# Patient Record
Sex: Female | Born: 1970 | Hispanic: Yes | Marital: Married | Smoking: Never smoker
Health system: Southern US, Community
[De-identification: ages and names within clinical notes are randomized; demographics above are authoritative.]

## PROBLEM LIST (undated history)

## (undated) DIAGNOSIS — I10 Essential (primary) hypertension: Secondary | ICD-10-CM

---

## 2004-09-25 ENCOUNTER — Ambulatory Visit: Payer: Self-pay | Admitting: Emergency Medicine

## 2004-09-25 ENCOUNTER — Emergency Department: Payer: Self-pay | Admitting: Emergency Medicine

## 2013-09-02 ENCOUNTER — Ambulatory Visit: Payer: Self-pay

## 2017-07-26 ENCOUNTER — Other Ambulatory Visit: Payer: Self-pay

## 2017-07-26 ENCOUNTER — Encounter: Payer: Self-pay | Admitting: Emergency Medicine

## 2017-07-26 DIAGNOSIS — Z23 Encounter for immunization: Secondary | ICD-10-CM | POA: Insufficient documentation

## 2017-07-26 DIAGNOSIS — Z79899 Other long term (current) drug therapy: Secondary | ICD-10-CM | POA: Insufficient documentation

## 2017-07-26 DIAGNOSIS — I10 Essential (primary) hypertension: Secondary | ICD-10-CM | POA: Insufficient documentation

## 2017-07-26 DIAGNOSIS — K8010 Calculus of gallbladder with chronic cholecystitis without obstruction: Principal | ICD-10-CM | POA: Insufficient documentation

## 2017-07-26 LAB — COMPREHENSIVE METABOLIC PANEL
ALBUMIN: 3.9 g/dL (ref 3.5–5.0)
ALK PHOS: 117 U/L (ref 38–126)
ALT: 25 U/L (ref 14–54)
AST: 27 U/L (ref 15–41)
Anion gap: 12 (ref 5–15)
BUN: 7 mg/dL (ref 6–20)
CALCIUM: 9 mg/dL (ref 8.9–10.3)
CO2: 22 mmol/L (ref 22–32)
CREATININE: 0.49 mg/dL (ref 0.44–1.00)
Chloride: 99 mmol/L — ABNORMAL LOW (ref 101–111)
GFR calc Af Amer: >60 mL/min (ref >60)
GFR calc non Af Amer: >60 mL/min (ref >60)
GLUCOSE: 103 mg/dL — AB (ref 65–99)
Potassium: 3 mmol/L — ABNORMAL LOW (ref 3.5–5.1)
SODIUM: 133 mmol/L — AB (ref 135–145)
Total Bilirubin: 0.5 mg/dL (ref 0.3–1.2)
Total Protein: 7.7 g/dL (ref 6.5–8.1)

## 2017-07-26 LAB — CBC
HCT: 33.7 % — ABNORMAL LOW (ref 35.0–47.0)
HEMOGLOBIN: 11.6 g/dL — AB (ref 12.0–16.0)
MCH: 27.6 pg (ref 26.0–34.0)
MCHC: 34.3 g/dL (ref 32.0–36.0)
MCV: 80.7 fL (ref 80.0–100.0)
PLATELETS: 393 10*3/uL (ref 150–440)
RBC: 4.18 MIL/uL (ref 3.80–5.20)
RDW: 13.9 % (ref 11.5–14.5)
WBC: 11.6 10*3/uL — ABNORMAL HIGH (ref 3.6–11.0)

## 2017-07-26 LAB — URINALYSIS, COMPLETE (UACMP) WITH MICROSCOPIC
Bilirubin Urine: NEGATIVE
Glucose, UA: NEGATIVE mg/dL
Ketones, ur: NEGATIVE mg/dL
Nitrite: NEGATIVE
Protein, ur: NEGATIVE mg/dL
SPECIFIC GRAVITY, URINE: 1.005 (ref 1.005–1.030)
pH: 6 (ref 5.0–8.0)

## 2017-07-26 LAB — LIPASE, BLOOD: Lipase: 24 U/L (ref 11–51)

## 2017-07-26 LAB — POCT PREGNANCY, URINE: PREG TEST UR: NEGATIVE

## 2017-07-26 NOTE — ED Triage Notes (Addendum)
Patient with complaint of right upper abdominal pain that started on Tuesday. Patient was seen by PCP on Wednesday. Patient was started on Dicyclomine with no improvement. Patient PCP told her that it maybe her gallbladder.  Patient with rash to right arm that started this morning.

## 2017-07-27 ENCOUNTER — Encounter
Admission: EM | Disposition: A | Discharge status: Home / Self Care | Payer: Self-pay | Source: Home / Self Care | Attending: Emergency Medicine

## 2017-07-27 ENCOUNTER — Observation Stay
Admission: EM | Admit: 2017-07-27 | Discharge: 2017-07-28 | Disposition: A | Discharge status: Home / Self Care | Payer: Self-pay | Attending: Surgery | Admitting: Surgery

## 2017-07-27 ENCOUNTER — Encounter: Payer: Self-pay | Admitting: Anesthesiology

## 2017-07-27 ENCOUNTER — Observation Stay: Payer: Self-pay | Admitting: Anesthesiology

## 2017-07-27 ENCOUNTER — Other Ambulatory Visit: Payer: Self-pay

## 2017-07-27 ENCOUNTER — Emergency Department: Payer: Self-pay

## 2017-07-27 DIAGNOSIS — K819 Cholecystitis, unspecified: Secondary | ICD-10-CM

## 2017-07-27 DIAGNOSIS — E876 Hypokalemia: Secondary | ICD-10-CM

## 2017-07-27 DIAGNOSIS — K811 Chronic cholecystitis: Secondary | ICD-10-CM

## 2017-07-27 DIAGNOSIS — K810 Acute cholecystitis: Secondary | ICD-10-CM | POA: Diagnosis present

## 2017-07-27 HISTORY — DX: Essential (primary) hypertension: I10

## 2017-07-27 HISTORY — PX: CHOLECYSTECTOMY: SHX55

## 2017-07-27 LAB — SURGICAL PCR SCREEN
MRSA, PCR: NEGATIVE
STAPHYLOCOCCUS AUREUS: NEGATIVE

## 2017-07-27 SURGERY — LAPAROSCOPIC CHOLECYSTECTOMY
Anesthesia: General

## 2017-07-27 MED ORDER — ENOXAPARIN SODIUM 40 MG/0.4ML ~~LOC~~ SOLN
40.0000 mg | SUBCUTANEOUS | Status: DC
  Administered 2017-07-28: 40 mg via SUBCUTANEOUS
  Filled 2017-07-27: qty 0.4

## 2017-07-27 MED ORDER — ACETAMINOPHEN 650 MG RE SUPP
650.0000 mg | Freq: Four times a day (QID) | RECTAL | Status: DC | PRN
  Filled 2017-07-27: qty 1

## 2017-07-27 MED ORDER — MORPHINE SULFATE (PF) 4 MG/ML IV SOLN
4.0000 mg | Freq: Once | INTRAVENOUS | Status: AC
  Administered 2017-07-27: 4 mg via INTRAVENOUS
  Filled 2017-07-27: qty 1

## 2017-07-27 MED ORDER — LIDOCAINE HCL (PF) 2 % IJ SOLN
INTRAMUSCULAR | Status: AC
  Filled 2017-07-27: qty 10

## 2017-07-27 MED ORDER — ONDANSETRON HCL 4 MG/2ML IJ SOLN
4.0000 mg | INTRAMUSCULAR | Status: AC
  Administered 2017-07-27: 4 mg via INTRAVENOUS
  Filled 2017-07-27: qty 2

## 2017-07-27 MED ORDER — LIDOCAINE HCL 1 % IJ SOLN
INTRAMUSCULAR | Status: DC | PRN
  Administered 2017-07-27: 10 mL

## 2017-07-27 MED ORDER — PIPERACILLIN-TAZOBACTAM 3.375 G IVPB 30 MIN: 3.3750 g | Freq: Three times a day (TID) | INTRAVENOUS | Status: DC

## 2017-07-27 MED ORDER — ONDANSETRON HCL 4 MG/2ML IJ SOLN
INTRAMUSCULAR | Status: DC | PRN
  Administered 2017-07-27: 4 mg via INTRAVENOUS

## 2017-07-27 MED ORDER — FENTANYL CITRATE (PF) 100 MCG/2ML IJ SOLN
25.0000 ug | INTRAMUSCULAR | Status: DC | PRN
  Administered 2017-07-27 (×2): 50 ug via INTRAVENOUS

## 2017-07-27 MED ORDER — PROPOFOL 10 MG/ML IV BOLUS
INTRAVENOUS | Status: AC
  Filled 2017-07-27: qty 20

## 2017-07-27 MED ORDER — ONDANSETRON HCL 4 MG/2ML IJ SOLN: 4.0000 mg | Freq: Four times a day (QID) | INTRAMUSCULAR | Status: DC | PRN

## 2017-07-27 MED ORDER — CHLORHEXIDINE GLUCONATE CLOTH 2 % EX PADS: 6.0000 | MEDICATED_PAD | Freq: Once | CUTANEOUS | Status: DC

## 2017-07-27 MED ORDER — ROCURONIUM BROMIDE 100 MG/10ML IV SOLN
INTRAVENOUS | Status: DC | PRN
  Administered 2017-07-27: 30 mg via INTRAVENOUS

## 2017-07-27 MED ORDER — MORPHINE SULFATE (PF) 2 MG/ML IV SOLN: 2.0000 mg | INTRAVENOUS | Status: DC | PRN

## 2017-07-27 MED ORDER — CHLORHEXIDINE GLUCONATE CLOTH 2 % EX PADS
6.0000 | MEDICATED_PAD | Freq: Once | CUTANEOUS | Status: AC
  Administered 2017-07-27: 6 via TOPICAL

## 2017-07-27 MED ORDER — DEXAMETHASONE SODIUM PHOSPHATE 10 MG/ML IJ SOLN
INTRAMUSCULAR | Status: DC | PRN
  Administered 2017-07-27: 10 mg via INTRAVENOUS

## 2017-07-27 MED ORDER — ONDANSETRON HCL 4 MG PO TABS: 4.0000 mg | ORAL_TABLET | Freq: Four times a day (QID) | ORAL | Status: DC | PRN

## 2017-07-27 MED ORDER — FENTANYL CITRATE (PF) 100 MCG/2ML IJ SOLN
INTRAMUSCULAR | Status: AC
  Filled 2017-07-27: qty 2

## 2017-07-27 MED ORDER — DEXAMETHASONE SODIUM PHOSPHATE 10 MG/ML IJ SOLN
INTRAMUSCULAR | Status: AC
  Filled 2017-07-27: qty 1

## 2017-07-27 MED ORDER — PROMETHAZINE HCL 25 MG/ML IJ SOLN
6.2500 mg | INTRAMUSCULAR | Status: DC | PRN
  Administered 2017-07-27: 12.5 mg via INTRAVENOUS

## 2017-07-27 MED ORDER — FENTANYL CITRATE (PF) 100 MCG/2ML IJ SOLN
INTRAMUSCULAR | Status: AC
  Administered 2017-07-27: 13:00:00
  Filled 2017-07-27: qty 2

## 2017-07-27 MED ORDER — PIPERACILLIN-TAZOBACTAM 3.375 G IVPB
3.3750 g | Freq: Three times a day (TID) | INTRAVENOUS | Status: DC
  Administered 2017-07-27 – 2017-07-28 (×4): 3.375 g via INTRAVENOUS
  Filled 2017-07-27 (×4): qty 50

## 2017-07-27 MED ORDER — MIDAZOLAM HCL 2 MG/2ML IJ SOLN
INTRAMUSCULAR | Status: DC | PRN
  Administered 2017-07-27: 2 mg via INTRAVENOUS

## 2017-07-27 MED ORDER — MORPHINE SULFATE (PF) 2 MG/ML IV SOLN
2.0000 mg | INTRAVENOUS | Status: DC | PRN
  Administered 2017-07-27 (×2): 2 mg via INTRAVENOUS
  Filled 2017-07-27 (×2): qty 1

## 2017-07-27 MED ORDER — OXYCODONE-ACETAMINOPHEN 5-325 MG PO TABS
1.0000 | ORAL_TABLET | ORAL | Status: DC | PRN
  Administered 2017-07-27 – 2017-07-28 (×3): 2 via ORAL
  Filled 2017-07-27 (×3): qty 2
  Filled 2017-07-27: qty 1

## 2017-07-27 MED ORDER — ONDANSETRON HCL 4 MG/2ML IJ SOLN
4.0000 mg | Freq: Four times a day (QID) | INTRAMUSCULAR | Status: DC | PRN
Start: 2017-07-27 — End: 2017-07-28

## 2017-07-27 MED ORDER — SODIUM CHLORIDE 0.9 % IV SOLN: Freq: Once | INTRAVENOUS | Status: DC

## 2017-07-27 MED ORDER — SUGAMMADEX SODIUM 200 MG/2ML IV SOLN
INTRAVENOUS | Status: DC | PRN
  Administered 2017-07-27: 150 mg via INTRAVENOUS

## 2017-07-27 MED ORDER — PROPOFOL 10 MG/ML IV BOLUS
INTRAVENOUS | Status: DC | PRN
  Administered 2017-07-27: 130 mg via INTRAVENOUS

## 2017-07-27 MED ORDER — ACETAMINOPHEN 325 MG PO TABS: 650.0000 mg | ORAL_TABLET | Freq: Four times a day (QID) | ORAL | Status: DC | PRN

## 2017-07-27 MED ORDER — LIDOCAINE HCL (CARDIAC) 20 MG/ML IV SOLN
INTRAVENOUS | Status: DC | PRN
  Administered 2017-07-27: 70 mg via INTRAVENOUS

## 2017-07-27 MED ORDER — KCL IN DEXTROSE-NACL 20-5-0.45 MEQ/L-%-% IV SOLN
INTRAVENOUS | Status: DC
  Administered 2017-07-27: 08:00:00 via INTRAVENOUS
  Filled 2017-07-27 (×3): qty 1000

## 2017-07-27 MED ORDER — INFLUENZA VAC SPLIT QUAD 0.5 ML IM SUSY
0.5000 mL | PREFILLED_SYRINGE | INTRAMUSCULAR | Status: AC
  Administered 2017-07-28: 0.5 mL via INTRAMUSCULAR
  Filled 2017-07-27: qty 0.5

## 2017-07-27 MED ORDER — LACTATED RINGERS IV SOLN
INTRAVENOUS | Status: DC | PRN
  Administered 2017-07-27: 10:00:00 via INTRAVENOUS

## 2017-07-27 MED ORDER — MIDAZOLAM HCL 2 MG/2ML IJ SOLN
INTRAMUSCULAR | Status: AC
  Filled 2017-07-27: qty 2

## 2017-07-27 MED ORDER — KCL IN DEXTROSE-NACL 20-5-0.45 MEQ/L-%-% IV SOLN
INTRAVENOUS | Status: DC
  Administered 2017-07-27 – 2017-07-28 (×3): via INTRAVENOUS
  Filled 2017-07-27 (×3): qty 1000

## 2017-07-27 MED ORDER — HEPARIN SODIUM (PORCINE) 5000 UNIT/ML IJ SOLN: 5000.0000 [IU] | Freq: Three times a day (TID) | INTRAMUSCULAR | Status: DC

## 2017-07-27 MED ORDER — SUGAMMADEX SODIUM 200 MG/2ML IV SOLN
INTRAVENOUS | Status: AC
  Filled 2017-07-27: qty 2

## 2017-07-27 MED ORDER — ONDANSETRON HCL 4 MG/2ML IJ SOLN
INTRAMUSCULAR | Status: AC
  Filled 2017-07-27: qty 2

## 2017-07-27 MED ORDER — FENTANYL CITRATE (PF) 100 MCG/2ML IJ SOLN
INTRAMUSCULAR | Status: DC | PRN
  Administered 2017-07-27 (×4): 50 ug via INTRAVENOUS

## 2017-07-27 MED ORDER — POTASSIUM CHLORIDE 10 MEQ/100ML IV SOLN
10.0000 meq | INTRAVENOUS | Status: DC
  Administered 2017-07-27 (×2): 10 meq via INTRAVENOUS
  Filled 2017-07-27 (×5): qty 100

## 2017-07-27 MED ORDER — ONDANSETRON 4 MG PO TBDP: 4.0000 mg | ORAL_TABLET | Freq: Four times a day (QID) | ORAL | Status: DC | PRN

## 2017-07-27 MED ORDER — SUCCINYLCHOLINE CHLORIDE 20 MG/ML IJ SOLN
INTRAMUSCULAR | Status: DC | PRN
  Administered 2017-07-27: 80 mg via INTRAVENOUS

## 2017-07-27 SURGICAL SUPPLY — 34 items
APPLIER CLIP ROT 10 11.4 M/L (STAPLE) ×3
CHLORAPREP W/TINT 26ML (MISCELLANEOUS) ×3 IMPLANT
CLIP APPLIE ROT 10 11.4 M/L (STAPLE) ×1 IMPLANT
DECANTER SPIKE VIAL GLASS SM (MISCELLANEOUS) IMPLANT
DERMABOND ADVANCED (GAUZE/BANDAGES/DRESSINGS) ×2
DERMABOND ADVANCED .7 DNX12 (GAUZE/BANDAGES/DRESSINGS) ×1 IMPLANT
DEVICE PMI PUNCTURE CLOSURE (MISCELLANEOUS) ×3 IMPLANT
DRESSING SURGICEL FIBRLLR 1X2 (HEMOSTASIS) ×1 IMPLANT
DRSG SURGICEL FIBRILLAR 1X2 (HEMOSTASIS) ×3
ELECT REM PT RETURN 9FT ADLT (ELECTROSURGICAL) ×3
ELECTRODE REM PT RTRN 9FT ADLT (ELECTROSURGICAL) ×1 IMPLANT
GLOVE BIO SURGEON STRL SZ7 (GLOVE) ×3 IMPLANT
GLOVE BIOGEL PI IND STRL 7.5 (GLOVE) ×1 IMPLANT
GLOVE BIOGEL PI INDICATOR 7.5 (GLOVE) ×2
GOWN STRL REUS W/ TWL LRG LVL3 (GOWN DISPOSABLE) ×3 IMPLANT
GOWN STRL REUS W/TWL LRG LVL3 (GOWN DISPOSABLE) ×6
IRRIGATION STRYKERFLOW (MISCELLANEOUS) IMPLANT
IRRIGATOR STRYKERFLOW (MISCELLANEOUS)
IV NS 1000ML (IV SOLUTION) ×2
IV NS 1000ML BAXH (IV SOLUTION) ×1 IMPLANT
KIT RM TURNOVER STRD PROC AR (KITS) ×3 IMPLANT
NEEDLE HYPO 22GX1.5 SAFETY (NEEDLE) ×3 IMPLANT
NEEDLE INSUFFLATION 14GA 120MM (NEEDLE) ×3 IMPLANT
NS IRRIG 1000ML POUR BTL (IV SOLUTION) ×3 IMPLANT
PACK LAP CHOLECYSTECTOMY (MISCELLANEOUS) ×3 IMPLANT
POUCH SPECIMEN RETRIEVAL 10MM (ENDOMECHANICALS) ×3 IMPLANT
SCISSORS METZENBAUM CVD 33 (INSTRUMENTS) IMPLANT
SLEEVE ENDOPATH XCEL 5M (ENDOMECHANICALS) ×6 IMPLANT
SUT MNCRL AB 4-0 PS2 18 (SUTURE) ×3 IMPLANT
SUT VICRYL 0 UR6 27IN ABS (SUTURE) ×3 IMPLANT
SUT VICRYL AB 3-0 FS1 BRD 27IN (SUTURE) ×3 IMPLANT
TROCAR XCEL NON-BLD 11X100MML (ENDOMECHANICALS) ×3 IMPLANT
TROCAR XCEL NON-BLD 5MMX100MML (ENDOMECHANICALS) ×3 IMPLANT
TUBING INSUFFLATION (TUBING) ×3 IMPLANT

## 2017-07-27 NOTE — Progress Notes (Signed)
Patient had episode of nausea and burping, nausea Med ordered to give as ordered.

## 2017-07-27 NOTE — Anesthesia Post-op Follow-up Note (Signed)
Anesthesia QCDR form completed.        

## 2017-07-27 NOTE — H&P (Signed)
Jasmine Bryant is an 46 y.o. female.    Chief Complaint: Right upper quadrant pain  HPI: This patient with 4 days of right upper quadrant pain.  It is been worsening.  Is now unrelenting.  She has had some nausea but no emesis.  She denies fevers or chills.  She has never had an episode like this before.  Her past medical history includes hypertension.  She has had a C-section and that his  She has no family history of gallbladder disease.  She does not smoke or drink.  Note that no interpreter was available at the time of this interview but she and I were able to understand each other and [place] and [place].  Consent for surgery will be obtained by Dr. Rosana Hoes when interpreter is available.  Past Medical History:  Diagnosis Date  . Hypertension     Past Surgical History:  Procedure Laterality Date  . CESAREAN SECTION      No family history on file. Social History:  reports that  has never smoked. she has never used smokeless tobacco. She reports that she does not drink alcohol. Her drug history is not on file.  Allergies: No Known Allergies   (Not in a hospital admission)   Review of Systems  Constitutional: Negative for chills and fever.  Respiratory: Negative for cough and shortness of breath.   Cardiovascular: Negative for chest pain, palpitations and orthopnea.  Gastrointestinal: Positive for abdominal pain and nausea. Negative for blood in stool, constipation, diarrhea, heartburn, melena and vomiting.  Genitourinary: Negative for dysuria and hematuria.  Musculoskeletal: Negative for back pain.  Neurological: Negative for dizziness and headaches.  Other systems reviewed and otherwise negative with the exception of that mentioned in the HPI.   Physical Exam:  BP (!) 145/86 (BP Location: Right Arm)   Pulse 71   Temp 98.2 F (36.8 C) (Oral)   Resp 20   Wt 143 lb (64.9 kg)   LMP 07/01/2017 (Exact Date)   SpO2 100%   Physical Exam  Constitutional: She is  oriented to person, place, and time and well-developed, well-nourished, and in no distress. No distress.  HENT:  Head: Normocephalic and atraumatic.  Eyes: Pupils are equal, round, and reactive to light. Right eye exhibits no discharge. Left eye exhibits no discharge. No scleral icterus.  Neck: Normal range of motion.  Cardiovascular: Normal rate, regular rhythm and normal heart sounds.  Pulmonary/Chest: Effort normal and breath sounds normal. No respiratory distress. She has no wheezes. She has no rales.  Abdominal: Soft. She exhibits no distension. There is tenderness. There is guarding. There is no rebound.  Tenderness in the right upper quadrant with a positive Murphy sign C-section scar  Musculoskeletal: Normal range of motion. She exhibits no edema.  Lymphadenopathy:    She has no cervical adenopathy.  Neurological: She is alert and oriented to person, place, and time.  Skin: Skin is warm and dry. No rash noted. She is not diaphoretic. No erythema.  Vitals reviewed.       Results for orders placed or performed during the hospital encounter of 07/27/17 (from the past 48 hour(s))  Lipase, blood     Status: None   Collection Time: 07/26/17 11:26 PM  Result Value Ref Range   Lipase 24 11 - 51 U/L  Comprehensive metabolic panel     Status: Abnormal   Collection Time: 07/26/17 11:26 PM  Result Value Ref Range   Sodium 133 (L) 135 - 145 mmol/L  Potassium 3.0 (L) 3.5 - 5.1 mmol/L   Chloride 99 (L) 101 - 111 mmol/L   CO2 22 22 - 32 mmol/L   Glucose, Bld 103 (H) 65 - 99 mg/dL   BUN 7 6 - 20 mg/dL   Creatinine, Ser 0.49 0.44 - 1.00 mg/dL   Calcium 9.0 8.9 - 10.3 mg/dL   Total Protein 7.7 6.5 - 8.1 g/dL   Albumin 3.9 3.5 - 5.0 g/dL   AST 27 15 - 41 U/L   ALT 25 14 - 54 U/L   Alkaline Phosphatase 117 38 - 126 U/L   Total Bilirubin 0.5 0.3 - 1.2 mg/dL   GFR calc non Af Amer >60 >60 mL/min   GFR calc Af Amer >60 >60 mL/min    Comment: (NOTE) The eGFR has been calculated using  the CKD EPI equation. This calculation has not been validated in all clinical situations. eGFR's persistently <60 mL/min signify possible Chronic Kidney Disease.    Anion gap 12 5 - 15  CBC     Status: Abnormal   Collection Time: 07/26/17 11:26 PM  Result Value Ref Range   WBC 11.6 (H) 3.6 - 11.0 K/uL   RBC 4.18 3.80 - 5.20 MIL/uL   Hemoglobin 11.6 (L) 12.0 - 16.0 g/dL   HCT 33.7 (L) 35.0 - 47.0 %   MCV 80.7 80.0 - 100.0 fL   MCH 27.6 26.0 - 34.0 pg   MCHC 34.3 32.0 - 36.0 g/dL   RDW 13.9 11.5 - 14.5 %   Platelets 393 150 - 440 K/uL  Urinalysis, Complete w Microscopic     Status: Abnormal   Collection Time: 07/26/17 11:26 PM  Result Value Ref Range   Color, Urine STRAW (A) YELLOW   APPearance CLEAR (A) CLEAR   Specific Gravity, Urine 1.005 1.005 - 1.030   pH 6.0 5.0 - 8.0   Glucose, UA NEGATIVE NEGATIVE mg/dL   Hgb urine dipstick MODERATE (A) NEGATIVE   Bilirubin Urine NEGATIVE NEGATIVE   Ketones, ur NEGATIVE NEGATIVE mg/dL   Protein, ur NEGATIVE NEGATIVE mg/dL   Nitrite NEGATIVE NEGATIVE   Leukocytes, UA TRACE (A) NEGATIVE   RBC / HPF 6-30 0 - 5 RBC/hpf   WBC, UA 0-5 0 - 5 WBC/hpf   Bacteria, UA RARE (A) NONE SEEN   Squamous Epithelial / LPF 0-5 (A) NONE SEEN  Pregnancy, urine POC     Status: None   Collection Time: 07/26/17 11:37 PM  Result Value Ref Range   Preg Test, Ur NEGATIVE NEGATIVE    Comment:        THE SENSITIVITY OF THIS METHODOLOGY IS >24 mIU/mL    US Abdomen Limited Ruq  Result Date: 07/27/2017 CLINICAL DATA:  Initial evaluation for acute right upper quadrant pain, positive Murphy sign EXAM: ULTRASOUND ABDOMEN LIMITED RIGHT UPPER QUADRANT COMPARISON:  None available. FINDINGS: Gallbladder: 2.4 cm nonmobile stone seen lodged at the gallbladder neck. Gallbladder wall mildly thickened to 3.5 mm. No free pericholecystic fluid. No sonographic Murphy sign elicited on exam, although evaluation limited as the patient was medicated prior to study. Common bile  duct: Diameter: 5.5 mm Liver: No focal lesion identified. Within increased hepatic echogenicity, suggesting steatosis. Portal vein is patent on color Doppler imaging with normal direction of blood flow towards the liver. IMPRESSION: 1. 2.4 cm nonmobile stone lodged at the gallbladder neck with mild gallbladder wall thickening. Clinical correlation for possible acute cholecystitis recommended. 2. No biliary dilatation. 3. Hepatic steatosis. Electronically Signed   By: Marland Kitchen  Jeannine Boga M.D.   On: 07/27/2017 05:08     Assessment/Plan  Depressed potassium with replacement in place.  Labs are further reviewed personally.  Ultrasound is personally reviewed showing a impacted stone with gallbladder wall thickening.  This a patient with acute cholecystitis.  I recommended admission to the hospital and institution of IV therapy with continued plans for laparoscopic cholecystectomy later today by Dr. Rosana Hoes.  Consent could not be obtained at this time due to the lack of interpreter availability therefore I will leave that to the operating surgeon Dr. Rosana Hoes for later today.  The patient voiced that she understood what I was recommending and agrees with the plan.  Florene Glen, MD, FACS

## 2017-07-27 NOTE — Anesthesia Preprocedure Evaluation (Signed)
Anesthesia Evaluation  Patient identified by MRN, date of birth, ID band Patient awake    Reviewed: Allergy & Precautions, H&P , NPO status , Patient's Chart, lab work & pertinent test results, reviewed documented beta blocker date and time   History of Anesthesia Complications Negative for: history of anesthetic complications  Airway Mallampati: II  TM Distance: >3 FB Neck ROM: full    Dental  (+) Dental Advidsory Given, Caps, Teeth Intact   Pulmonary neg pulmonary ROS,           Cardiovascular Exercise Tolerance: Good hypertension, (-) angina(-) CAD, (-) Past MI, (-) Cardiac Stents and (-) CABG (-) dysrhythmias (-) Valvular Problems/Murmurs     Neuro/Psych negative neurological ROS  negative psych ROS   GI/Hepatic negative GI ROS, Neg liver ROS,   Endo/Other  negative endocrine ROS  Renal/GU negative Renal ROS  negative genitourinary   Musculoskeletal   Abdominal   Peds  Hematology negative hematology ROS (+)   Anesthesia Other Findings Past Medical History: No date: Hypertension   Reproductive/Obstetrics negative OB ROS                             Anesthesia Physical Anesthesia Plan  ASA: II  Anesthesia Plan: General   Post-op Pain Management:    Induction: Intravenous  PONV Risk Score and Plan: 3 and Ondansetron and Dexamethasone  Airway Management Planned: Oral ETT  Additional Equipment:   Intra-op Plan:   Post-operative Plan: Extubation in OR  Informed Consent: I have reviewed the patients History and Physical, chart, labs and discussed the procedure including the risks, benefits and alternatives for the proposed anesthesia with the patient or authorized representative who has indicated his/her understanding and acceptance.   Dental Advisory Given  Plan Discussed with: Anesthesiologist, CRNA and Surgeon  Anesthesia Plan Comments:         Anesthesia Quick  Evaluation

## 2017-07-27 NOTE — Anesthesia Postprocedure Evaluation (Signed)
Anesthesia Post Note  Patient: Engineer, maintenanceBeatriz Macias Bryant  Procedure(s) Performed: LAPAROSCOPIC CHOLECYSTECTOMY (N/A )  Patient location during evaluation: PACU Anesthesia Type: General Level of consciousness: awake and alert Pain management: pain level controlled Vital Signs Assessment: post-procedure vital signs reviewed and stable Respiratory status: spontaneous breathing, nonlabored ventilation, respiratory function stable and patient connected to nasal cannula oxygen Cardiovascular status: blood pressure returned to baseline and stable Postop Assessment: no apparent nausea or vomiting Anesthetic complications: no     Last Vitals:  Vitals:   07/27/17 1216 07/27/17 1228  BP: (!) 149/76 (!) 158/75  Pulse: 83 81  Resp: 14   Temp:  36.8 C  SpO2: 98% 100%    Last Pain:  Vitals:   07/27/17 1228  TempSrc: Oral  PainSc:                  Lenard SimmerAndrew Taquisha Phung

## 2017-07-27 NOTE — Transfer of Care (Signed)
Immediate Anesthesia Transfer of Care Note  Patient: Jasmine Bryant  Procedure(s) Performed: Procedure(s): LAPAROSCOPIC CHOLECYSTECTOMY (N/A)  Patient Location: PACU  Anesthesia Type:General  Level of Consciousness: sedated  Airway & Oxygen Therapy: Patient Spontanous Breathing and Patient connected to face mask oxygen  Post-op Assessment: Report given to RN and Post -op Vital signs reviewed and stable  Post vital signs: Reviewed and stable  Last Vitals:  Vitals:   07/27/17 0841 07/27/17 1130  BP: 139/71 (!) 154/70  Pulse: 76 92  Resp:  16  Temp: 36.9 C 36.7 C  SpO2: 100% 100%    Complications: No apparent anesthesia complications

## 2017-07-27 NOTE — Progress Notes (Signed)
15 minute call to floor. 

## 2017-07-27 NOTE — ED Provider Notes (Signed)
[facility] Emergency Department Provider Note  ____________________________________________   First MD Initiated Contact with Patient 07/27/17 [phone]     (approximate)  I have reviewed the triage vital signs and the nursing notes.   HISTORY  Chief Complaint Abdominal Pain  The patient and/or family speak(s) Spanish.  They understand they have the right to the use of a hospital interpreter, however at this time they prefer to speak directly with me in Spanish.  They know that they can ask for an interpreter at any time.   HPI Jasmine Bryant is a 46 y.o. female with no relevant chronic medical history presents for evaluation of 3-4 days of constant and gradually worsening abdominal pain.  It is located in the right upper quadrant and epigastrium and radiates through to her back.  She had one episode of vomiting earlier today but is also had waxing and waning nausea for multiple days.  The symptoms started after eating breakfast 3-4 days ago and have persisted.  Eating makes his symptoms worse and nothing particular makes them better.  She saw her regular doctor who gave her a prescription for Bentyl but it has not helped.  The doctor did mention that it is possible she may have gallstones.  She has not had any recent surgeries.  She denies any issues with diarrhea or constipation.  She denies dysuria.  She also denies fever/chills, chest pain, and shortness of breath.  Her pain is severe and she describes it as an aching and dull pain that is constant.  Past Medical History:  Diagnosis Date  . Hypertension     Patient Active Problem List   Diagnosis Date Noted  . Acute cholecystitis 07/27/2017    Past Surgical History:  Procedure Laterality Date  . CESAREAN SECTION      Prior to Admission medications   Medication Sig Start Date End Date Taking? Authorizing Provider  DICYCLOMINE HCL PO Take by mouth.   Yes [provider]     Allergies Patient has no known allergies.  No family history on file.  Social History Social History   Tobacco Use  . Smoking status: Never Smoker  . Smokeless tobacco: Never Used  Substance Use Topics  . Alcohol use: No    Frequency: Never  . Drug use: Not on file    Review of Systems Constitutional: No fever/chills Eyes: No visual changes. ENT: No sore throat. Cardiovascular: Denies chest pain. Respiratory: Denies shortness of breath. Gastrointestinal: RUQ and epigastric pain x 3-4 days.  One episode emesis Genitourinary: Negative for dysuria. Musculoskeletal: Negative for neck pain.  Negative for back pain. Integumentary: Negative for rash. Neurological: Negative for headaches, focal weakness or numbness.   ____________________________________________   PHYSICAL EXAM:  VITAL SIGNS: ED Triage Vitals [07/26/17 2323]  Enc Vitals Group     BP (!) 158/74     Pulse Rate 70     Resp 18     Temp 98.2 F (36.8 C)     Temp Source Oral     SpO2 100 %     Weight 64.9 kg (143 lb)     Height      Head Circumference      Peak Flow      Pain Score 10     Pain Loc      Pain Edu?      Excl. in GC?     Constitutional: Alert and oriented. Well appearing and in no acute distress. Eyes: Conjunctivae are  normal.  Head: Atraumatic. Nose: No congestion/rhinnorhea. Cardiovascular: Normal rate, regular rhythm. Good peripheral circulation. Grossly normal heart sounds. Respiratory: Normal respiratory effort.  No retractions. Lungs CTAB. Gastrointestinal: Soft.  Moderate to severe tenderness to palpation of the RUQ w/ +Murphy's sign.  Moderate tenderness of the epigastrium.  No lower abd tenderness.  No rebound/guarding/peritonitis. Musculoskeletal: No lower extremity tenderness nor edema. No gross deformities of extremities. Neurologic:  Normal speech and language. No gross focal neurologic deficits are appreciated.  Skin:  Skin is warm, dry and intact. No rash  noted. Psychiatric: Mood and affect are normal. Speech and behavior are normal.  ____________________________________________   LABS (all labs ordered are listed, but only abnormal results are displayed)  Labs Reviewed  COMPREHENSIVE METABOLIC PANEL - Abnormal; Notable for the following components:      Result Value   Sodium 133 (*)    Potassium 3.0 (*)    Chloride 99 (*)    Glucose, Bld 103 (*)    All other components within normal limits  CBC - Abnormal; Notable for the following components:   WBC 11.6 (*)    Hemoglobin 11.6 (*)    HCT 33.7 (*)    All other components within normal limits  URINALYSIS, COMPLETE (UACMP) WITH MICROSCOPIC - Abnormal; Notable for the following components:   Color, Urine STRAW (*)    APPearance CLEAR (*)    Hgb urine dipstick MODERATE (*)    Leukocytes, UA TRACE (*)    Bacteria, UA RARE (*)    Squamous Epithelial / LPF 0-5 (*)    All other components within normal limits  LIPASE, BLOOD  HIV ANTIBODY (ROUTINE TESTING)  CBC  CREATININE, SERUM  POC URINE PREG, ED  POCT PREGNANCY, URINE   ____________________________________________  EKG  None - EKG not ordered by ED physician ____________________________________________  RADIOLOGY   US Abdomen Limited Ruq  Result Date: 07/27/2017 CLINICAL DATA:  Initial evaluation for acute right upper quadrant pain, positive Murphy sign EXAM: ULTRASOUND ABDOMEN LIMITED RIGHT UPPER QUADRANT COMPARISON:  None available. FINDINGS: Gallbladder: 2.4 cm nonmobile stone seen lodged at the gallbladder neck. Gallbladder wall mildly thickened to 3.5 mm. No free pericholecystic fluid. No sonographic Murphy sign elicited on exam, although evaluation limited as the patient was medicated prior to study. Common bile duct: Diameter: 5.5 mm Liver: No focal lesion identified. Within increased hepatic echogenicity, suggesting steatosis. Portal vein is patent on color Doppler imaging with normal direction of blood flow towards  the liver. IMPRESSION: 1. 2.4 cm nonmobile stone lodged at the gallbladder neck with mild gallbladder wall thickening. Clinical correlation for possible acute cholecystitis recommended. 2. No biliary dilatation. 3. Hepatic steatosis. Electronically Signed   By: Rise Mu M.D.   On: 07/27/2017 05:08    ____________________________________________   PROCEDURES  Critical Care performed: No   Procedure(s) performed:   Procedures   ____________________________________________   INITIAL IMPRESSION / ASSESSMENT AND PLAN / ED COURSE  As part of my medical decision making, I reviewed the following data within the electronic MEDICAL RECORD NUMBER Nursing notes reviewed and incorporated, Labs reviewed , Discussed with admitting physician  and A consult was requested and obtained from this/these consultant(s) Surgery    Differential diagnosis includes, but is not limited to, biliary disease (biliary colic, acute cholecystitis, cholangitis, choledocholithiasis, etc), intrathoracic causes for epigastric abdominal pain including ACS, gastritis, duodenitis, pancreatitis, small bowel or large bowel obstruction, abdominal aortic aneurysm, hernia, and gastritis.  Labs are unremarkable except for hypokalemia of unknown significance, and  vital signs are stable.  Presentation is most consistent with biliary colic.  Patient does appear to be uncomfortable and is tender/painful on exam.  Zofran and ultrasound.  I will reassess once the ultrasound results are back.  Clinical Course as of Jul 27 548  Sat Jul 27, 2017  [id number] Potassium: (!) 3.0 [CF]  [id number] Large nonmobile stone in neck of gallbladder with mild wall thickening.  Discussed case by phone with Dr. Excell Seltzerooper who will come to the ED to evaluate the patient.  I updated the patient on the plan.  Pain is still present, but improved with morphine. US ABDOMEN LIMITED RUQ [CF]  0538 Discussed case in person with Dr. Excell Seltzerooper and he will admit for surgery.  I  am ordering IV potassium for her to get while awaiting surgery.  [CF]    Clinical Course User Index [CF] Loleta RoseForbach, Maressa Apollo, MD    ____________________________________________  FINAL CLINICAL IMPRESSION(S) / ED DIAGNOSES  Final diagnoses:  Hypokalemia  Cholecystitis     MEDICATIONS GIVEN DURING THIS VISIT:  Medications  potassium chloride 10 mEq in 100 mL IVPB (not administered)  0.9 %  sodium chloride infusion (not administered)  heparin injection 5,000 Units (not administered)  dextrose 5 % and 0.45 % NaCl with KCl 20 mEq/L infusion (not administered)  ondansetron (ZOFRAN) tablet 4 mg (not administered)    Or  ondansetron (ZOFRAN) injection 4 mg (not administered)  morphine 2 MG/ML injection 2 mg (not administered)  morphine 4 MG/ML injection 4 mg (4 mg Intravenous Given 07/27/17 0420)  ondansetron (ZOFRAN) injection 4 mg (4 mg Intravenous Given 07/27/17 0418)     ED Discharge Orders    None       Note:  This document was prepared using Dragon voice recognition software and may include unintentional dictation errors.    Loleta RoseForbach, Latrecia Capito, MD 07/27/17 [phone]

## 2017-07-27 NOTE — Anesthesia Procedure Notes (Signed)
Procedure Name: Intubation Date/Time: 07/27/2017 9:41 AM Performed by: Doreen Salvage, CRNA Pre-anesthesia Checklist: Patient identified, Patient being monitored, Timeout performed, Emergency Drugs available and Suction available Patient Re-evaluated:Patient Re-evaluated prior to induction Oxygen Delivery Method: Circle system utilized Preoxygenation: Pre-oxygenation with 100% oxygen Induction Type: IV induction Ventilation: Mask ventilation without difficulty Laryngoscope Size: Mac and 3 Grade View: Grade III Tube type: Oral Tube size: 7.0 mm Number of attempts: 1 Airway Equipment and Method: Stylet Placement Confirmation: ETT inserted through vocal cords under direct vision,  positive ETCO2 and breath sounds checked- equal and bilateral Secured at: 21 cm Tube secured with: Tape Dental Injury: Teeth and Oropharynx as per pre-operative assessment  Difficulty Due To: Difficult Airway- due to limited oral opening

## 2017-07-27 NOTE — ED Notes (Signed)
Patient transported to Ultrasound 

## 2017-07-27 NOTE — ED Notes (Addendum)
Per interpreter pt c/o of RUQ pain since Tues, received doxycycline from clinic for unknown reason, only known med condition is HTN for the last 3 months unk med,   Only abdominal surgery was c-section 12 years ago, pain to palpation, worse with deep inhalation, pt denies uti s/sx (frequency, burning)

## 2017-07-27 NOTE — Op Note (Signed)
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 female who this admission presented with RUQ > epigastric abdominal pain. Ultrasound suggested acute calculous cholecystitis. All risks, benefits, and alternatives to above elective procedures were discussed with the patient via English-Spanish language translator, patient elected to proceed, and informed consent was accordingly obtained at that time.   DETAILS OF PROCEDURE:  Patient was brought to the operating suite and appropriately identified. General anesthesia was administered along with peri-operative prophylactic IV antibiotics, and endotracheal intubation was performed by anesthesiologist, along with NG/OG tube for gastric decompression. In supine position, operative site was prepped and draped in usual sterile fashion, and following a brief time out, initial 5 mm incision was made in a natural skin crease just above the umbilicus. Fascia was then elevated, and a Verress needle was inserted and its proper position confirmed using aspiration and saline meniscus test.  Upon insufflation of the abdominal cavity with carbon dioxide to a well-tolerated pressure of 12-15 mmHg, 5 mm  peri-umbilical port followed by laparoscope were inserted and used to inspect the abdominal cavity and its contents with no injuries from insertion of the first trochar noted. Three additional trocars were inserted, one at the epigastric position (10 mm) and two along the Right costal margin (5 mm). The table was then placed in reverse Trendelenburg position with the Right side up. Filmy adhesions between the gallbladder and omentum/duodenum/transverse colon were lysed using combined blunt dissection and selective electrocautery. The apex/dome of the gallbladder was grasped with an atraumatic grasper passed through the lateral port and retracted apically over the liver. The infundibulum was also grasped and retracted, exposing Calot's triangle. The peritoneum overlying the gallbladder infundibulum was incised and dissected free of surrounding peritoneal attachments, revealing the cystic duct and cystic artery, which were clipped twice on the patient side and once on the gallbladder specimen side close to the gallbladder. The gallbladder was then dissected from its peritoneal attachments to the liver using electrocautery, though gallbladder peeled off of the liver bed in some areas due to quite significant pericholecystic edema, and the gallbladder was placed into a laparoscopic specimen bag and removed from the abdominal cavity via the epigastric port site. Hemostasis and secure placement of clips were confirmed, a small piece of Surgicel Fibrillar hemostatic agent was placed in the gallbladder fossa to further facilitate/ensure hemostasis, and intra-peritoneal cavity was inspected with no additional findings. PMI laparoscopic fascial closure device was then used to re-approximate fascia at the 10 mm epigastric port site.  All ports were then removed under direct visualization, and abdominal cavity was desuflated. All port sites were irrigated/cleaned, additional local anesthetic was injected at each incision, 3-0  Vicryl was used to re-approximate dermis at 10 mm port site(s), and subcuticular 4-0 Monocryl suture was used to re-approximate skin. Skin was then cleaned, dried, and sterile skin glue was applied. Patient was then safely able to  be awakened, extubated, and transferred to PACU for post-operative monitoring and care.   I was present for all aspects of procedure, and there were no intra-operative complications apparent.

## 2017-07-28 ENCOUNTER — Encounter: Payer: Self-pay | Admitting: Surgery

## 2017-07-28 MED ORDER — OXYCODONE-ACETAMINOPHEN 5-325 MG PO TABS: 1.0000 | ORAL_TABLET | ORAL | 0 refills | Status: DC | PRN

## 2017-07-28 NOTE — Discharge Summary (Signed)
Physician Discharge Summary  Patient ID: Reino KentBeatriz Macias Vega MRN: [id number] DOB/AGE: 46/11/1970 46 y.o.  Admit date: 07/27/2017 Discharge date: 07/28/2017  Admission Diagnoses:  Discharge Diagnoses:  Active Problems:   Acute cholecystitis   Discharged Condition: good  Hospital Course: 46 y.o. female presented [facility] ED for abdominal pain. Workup was found to be significant for ultrasound imaging demonstrating acute calculous cholecystitis. Informed consent was obtained with the assistance of certified English-Spanish interpreter telephone service and documented, and patient underwent uneventful laparoscopic cholecystectomy Earlene Plater(Ayven Pheasant, 07/27/2017).  Post-operatively, patient's pain improved/resolved and advancement of patient's diet and ambulation were well-tolerated. The remainder of patient's hospital course was essentially unremarkable, and discharge planning was initiated accordingly with patient safely able to be discharged home with appropriate discharge instructions (in [place] and Spanish), pain control, and outpatient surgical follow-up after all of her and her husband's questions were answered to their expressed satisfaction.  Consults: None  Significant Diagnostic Studies: radiology: Ultrasound: acute calculous cholecystitis  Treatments: IV hydration, antibiotics: Zosyn and surgery: laparoscopic cholecystectomy  Discharge Exam: Blood pressure 122/63, pulse 77, temperature (!) 97.3 F (36.3 C), temperature source Oral, resp. rate 16, height 5' (1.524 m), weight 141 lb 6.4 oz (64.1 kg), last menstrual period 07/01/2017, SpO2 99 %. General appearance: alert, cooperative and no distress GI: abdomen soft and non-distended, appropriate/moderate epigastric peri-incisional tenderness to palpation with incisions well-approximated without any surrounding erythema or drainage  Disposition: 01-Home or Self Care   Allergies as of 07/28/2017   No Known Allergies     Medication List     TAKE these medications   DICYCLOMINE HCL PO Take by mouth.   oxyCODONE-acetaminophen 5-325 MG tablet Commonly known as:  PERCOCET/ROXICET Take 1-2 tablets by mouth every 4 (four) hours as needed for severe pain.      Follow-up Information    [facility]. Schedule an appointment as soon as possible for a visit in 2 week(s).   Specialty:  General Surgery Contact information: [address] [place] [id number] [phone]          Signed: Ancil LinseyJason Evan Sair Faulcon 07/28/2017, 5:02 PM

## 2017-07-28 NOTE — Care Management Note (Signed)
Case Management Note  Patient Details  Name: Jasmine Bryant MRN: [id number] Date of Birth: 11/17/1970  Subjective/Objective:   Discharge to home. No home health services.                 Action/Plan:   Expected Discharge Date:  07/28/17               Expected Discharge Plan:     In-House Referral:     Discharge planning Services     Post Acute Care Choice:    Choice offered to:     DME Arranged:    DME Agency:     HH Arranged:    HH Agency:     Status of Service:     If discussed at [employer] Meetings, dates discussed:    Additional Comments:  [name] A, RN 07/28/2017, 8:49 AM

## 2017-07-28 NOTE — Discharge Instructions (Signed)
In addition to included general post-operative instructions for Laparoscopic Cholecystectomy, ° °Diet: Gradually resume home heart healthy diet.  ° °Activity: No heavy lifting >20 pounds (children, pets, laundry, garbage) or strenuous activity until follow-up, but light activity and walking are encouraged. Do not drive or drink alcohol if taking narcotic pain medications. ° °Wound care: 2 days after surgery (Monday, 12/3), may shower/get incision wet with soapy water and pat dry (do not rub incisions), but no baths or submerging incision underwater until follow-up.  ° °Medications: Resume all home medications. For mild to moderate pain: acetaminophen (Tylenol) or ibuprofen/naproxen (if no kidney disease). Combining Tylenol with alcohol can substantially increase your risk of causing liver disease. Narcotic pain medications, if prescribed, can be used for severe pain, though may cause nausea, constipation, and drowsiness. Do not combine Tylenol and Percocet (or similar) within a 6 hour period as Percocet (and similar) contain(s) Tylenol. If you do not need the narcotic pain medication, you do not need to fill the prescription. ° °Call office ([phone]) at any time if any questions, worsening pain, fevers/chills, bleeding, drainage from incision site, or other concerns. °

## 2017-07-29 LAB — HIV ANTIBODY (ROUTINE TESTING W REFLEX): HIV SCREEN 4TH GENERATION: NONREACTIVE

## 2017-07-31 LAB — SURGICAL PATHOLOGY

## 2017-08-08 DIAGNOSIS — K811 Chronic cholecystitis: Secondary | ICD-10-CM

## 2017-08-12 ENCOUNTER — Ambulatory Visit (INDEPENDENT_AMBULATORY_CARE_PROVIDER_SITE_OTHER): Payer: Self-pay | Admitting: Surgery

## 2017-08-12 ENCOUNTER — Encounter: Payer: Self-pay | Admitting: Surgery

## 2017-08-12 VITALS — BP 127/80 | HR 65 | Temp 97.5°F | Ht 61.0 in | Wt 140.6 lb

## 2017-08-12 DIAGNOSIS — K810 Acute cholecystitis: Secondary | ICD-10-CM

## 2017-08-12 DIAGNOSIS — K811 Chronic cholecystitis: Secondary | ICD-10-CM

## 2017-08-12 NOTE — Patient Instructions (Addendum)
   You may try over the counter Antacids to help with the heartburn and indigestion.   GENERAL POST-OPERATIVE PATIENT INSTRUCTIONS   WOUND CARE INSTRUCTIONS:  Keep a dry clean dressing on the wound if there is drainage. The initial bandage may be removed after 24 hours.  Once the wound has quit draining you may leave it open to air.  If clothing rubs against the wound or causes irritation and the wound is not draining you may cover it with a dry dressing during the daytime.  Try to keep the wound dry and avoid ointments on the wound unless directed to do so.  If the wound becomes bright red and painful or starts to drain infected material that is not clear, please contact your physician immediately.  If the wound is mildly pink and has a thick firm ridge underneath it, this is normal, and is referred to as a healing ridge.  This will resolve over the next 4-6 weeks.  BATHING: You may shower if you have been informed of this by your surgeon. However, Please do not submerge in a tub, hot tub, or pool until incisions are completely sealed or have been told by your surgeon that you may do so.  DIET:  You may eat any foods that you can tolerate.  It is a good idea to eat a high fiber diet and take in plenty of fluids to prevent constipation.  If you do become constipated you may want to take a mild laxative or take ducolax tablets on a daily basis until your bowel habits are regular.  Constipation can be very uncomfortable, along with straining, after recent surgery.  ACTIVITY:  You are encouraged to cough and deep breath or use your incentive spirometer if you were given one, every 15-30 minutes when awake.  This will help prevent respiratory complications and low grade fevers post-operatively if you had a general anesthetic.  You may want to hug a pillow when coughing and sneezing to add additional support to the surgical area, if you had abdominal or chest surgery, which will decrease pain during these  times.  You are encouraged to walk and engage in light activity for the next two weeks.  You should not lift more than 20 pounds, until 09/07/2016 as it could put you at increased risk for complications.  Twenty pounds is roughly equivalent to a plastic bag of groceries. At that time- Listen to your body when lifting, if you have pain when lifting, stop and then try again in a few days. Soreness after doing exercises or activities of daily living is normal as you get back in to your normal routine.  MEDICATIONS:  Try to take narcotic medications and anti-inflammatory medications, such as tylenol, ibuprofen, naprosyn, etc., with food.  This will minimize stomach upset from the medication.  Should you develop nausea and vomiting from the pain medication, or develop a rash, please discontinue the medication and contact your physician.  You should not drive, make important decisions, or operate machinery when taking narcotic pain medication.  SUNBLOCK Use sun block to incision area over the next year if this area will be exposed to sun. This helps decrease scarring and will allow you avoid a permanent darkened area over your incision.  QUESTIONS:  Please feel free to call our office if you have any questions, and we will be glad to assist you. [phone]

## 2017-08-12 NOTE — Progress Notes (Signed)
Surgical Clinic Progress/Follow-up Note   HPI:  46 y.o. Female presents to clinic for post-op follow-up evaluation s/p laparoscopic cholecystectomy for acute on chronic cholecystitis. Patient reports complete resolution of pre-operative RUQ abdominal pain and has been tolerating regular diet with +flatus. Since surgery, her epigastric abdominal pain has improved noticeably, though has not resolved, and she reports 4 loose BM's yesterday with only one mildly loose BM today. She otherwise describes persistence of pre-surgical untreated heartburn, denies N/V, fever/chills, CP, or SOB.  Review of Systems:  Constitutional: denies any other weight loss, fever, chills, or sweats  Eyes: denies any other vision changes, history of eye injury  ENT: denies sore throat, hearing problems  Respiratory: denies shortness of breath, wheezing  Cardiovascular: denies chest pain, palpitations  Gastrointestinal: abdominal pain, N/V, and bowel function as per HPI Musculoskeletal: denies any other joint pains or cramps  Skin: Denies any other rashes or skin discolorations  Neurological: denies any other headache, dizziness, weakness  Psychiatric: denies any other depression, anxiety  All other review of systems: otherwise negative   Vital Signs:  BP 127/80   Pulse 65   Temp (!) 97.5 F (36.4 C) (Oral)   Ht 5\' 1"  (1.549 m)   Wt 140 lb 9.6 oz (63.8 kg)   BMI 26.57 kg/m    Physical Exam:  Constitutional:  -- Normal body habitus  -- Awake, alert, and oriented x3  Eyes:  -- Pupils equally round and reactive to light  -- No scleral icterus  Ear, nose, throat:  -- No jugular venous distension  -- No nasal drainage, bleeding Pulmonary:  -- No crackles -- Equal breath sounds bilaterally -- Breathing non-labored at rest Cardiovascular:  -- S1, S2 present  -- No pericardial rubs  Gastrointestinal:  -- Soft, minimal epigastric peri-incisional tenderness to palpation, non-distended, no guarding/rebound  tenderness -- Incisions well-approximated without any surrounding erythema or drainage -- No abdominal masses appreciated, pulsatile or otherwise  Musculoskeletal / Integumentary:  -- Wounds or skin discoloration: None appreciated except post-surgical wounds as described above (GI) -- Extremities: B/L UE and LE FROM, hands and feet warm, no edema  Neurologic:  -- Motor function: intact and symmetric  -- Sensation: intact and symmetric   Assessment:  46 y.o. yo Female with a problem list including...  Patient Active Problem List   Diagnosis Date Noted  . Chronic cholecystitis   . Acute cholecystitis 07/27/2017    presents to clinic for post-op follow-up evaluation, doing well s/p laparoscopic cholecystectomy for acute on chronic cholecystitis.  Plan:              - advance diet as tolerated  - Tylenol/ibuprofen/naproxyn prn for mild - moderate pain             - okay to submerge incisions under water (baths, swimming) prn             - gradually resume all activities without restrictions over next 2 weeks             - apply sunblock particularly to incisions with sun exposure to reduce pigmentation of scars             - return to clinic as needed, instructed to call office if any questions or concerns   All of the above recommendations were discussed with the patient and patient's family via certified translator, and all of patient's and family's questions were answered to their expressed satisfaction.  -- Scherrie GerlachJason E. Earlene Plateravis, MD, RPVI [place]: [facility] - Partnering for exceptional care. Office: [phone]

## 2019-08-28 ENCOUNTER — Emergency Department
Admission: EM | Admit: 2019-08-28 | Discharge: 2019-08-28 | Disposition: A | Discharge status: Home / Self Care | Payer: Self-pay | Attending: Emergency Medicine | Admitting: Emergency Medicine

## 2019-08-28 ENCOUNTER — Emergency Department: Payer: Self-pay

## 2019-08-28 ENCOUNTER — Other Ambulatory Visit: Payer: Self-pay

## 2019-08-28 ENCOUNTER — Encounter: Payer: Self-pay | Admitting: Emergency Medicine

## 2019-08-28 DIAGNOSIS — J189 Pneumonia, unspecified organism: Secondary | ICD-10-CM

## 2019-08-28 DIAGNOSIS — I10 Essential (primary) hypertension: Secondary | ICD-10-CM | POA: Insufficient documentation

## 2019-08-28 DIAGNOSIS — U071 COVID-19: Secondary | ICD-10-CM | POA: Insufficient documentation

## 2019-08-28 DIAGNOSIS — J1289 Other viral pneumonia: Secondary | ICD-10-CM | POA: Insufficient documentation

## 2019-08-28 LAB — COMPREHENSIVE METABOLIC PANEL
ALT: 21 U/L (ref 0–44)
AST: 19 U/L (ref 15–41)
Albumin: 4.1 g/dL (ref 3.5–5.0)
Alkaline Phosphatase: 71 U/L (ref 38–126)
Anion gap: 13 (ref 5–15)
BUN: 5 mg/dL — ABNORMAL LOW (ref 6–20)
CO2: 21 mmol/L — ABNORMAL LOW (ref 22–32)
Calcium: 9 mg/dL (ref 8.9–10.3)
Chloride: 93 mmol/L — ABNORMAL LOW (ref 98–111)
Creatinine, Ser: 0.49 mg/dL (ref 0.44–1.00)
GFR calc Af Amer: >60 mL/min (ref >60)
GFR calc non Af Amer: >60 mL/min (ref >60)
Glucose, Bld: 103 mg/dL — ABNORMAL HIGH (ref 70–99)
Potassium: 3.2 mmol/L — ABNORMAL LOW (ref 3.5–5.1)
Sodium: 127 mmol/L — ABNORMAL LOW (ref 135–145)
Total Bilirubin: 0.6 mg/dL (ref 0.3–1.2)
Total Protein: 7.8 g/dL (ref 6.5–8.1)

## 2019-08-28 LAB — TROPONIN I (HIGH SENSITIVITY)
Troponin I (High Sensitivity): <2 ng/L (ref <18)
Troponin I (High Sensitivity): 2 ng/L (ref <18)

## 2019-08-28 LAB — CBC WITH DIFFERENTIAL/PLATELET
Abs Immature Granulocytes: 0.04 10*3/uL (ref 0.00–0.07)
Basophils Absolute: 0 10*3/uL (ref 0.0–0.1)
Basophils Relative: 0 %
Eosinophils Absolute: 0.1 10*3/uL (ref 0.0–0.5)
Eosinophils Relative: 1 %
HCT: 33.4 % — ABNORMAL LOW (ref 36.0–46.0)
Hemoglobin: 10.9 g/dL — ABNORMAL LOW (ref 12.0–15.0)
Immature Granulocytes: 0 %
Lymphocytes Relative: 31 %
Lymphs Abs: 3.8 10*3/uL (ref 0.7–4.0)
MCH: 26.8 pg (ref 26.0–34.0)
MCHC: 32.6 g/dL (ref 30.0–36.0)
MCV: 82.1 fL (ref 80.0–100.0)
Monocytes Absolute: 1 10*3/uL (ref 0.1–1.0)
Monocytes Relative: 8 %
Neutro Abs: 7.6 10*3/uL (ref 1.7–7.7)
Neutrophils Relative %: 60 %
Platelets: 420 10*3/uL — ABNORMAL HIGH (ref 150–400)
RBC: 4.07 MIL/uL (ref 3.87–5.11)
RDW: 13.8 % (ref 11.5–15.5)
WBC: 12.6 10*3/uL — ABNORMAL HIGH (ref 4.0–10.5)
nRBC: 0 % (ref 0.0–0.2)

## 2019-08-28 MED ORDER — CEFPODOXIME PROXETIL 200 MG PO TABS: 200.0000 mg | ORAL_TABLET | Freq: Two times a day (BID) | ORAL | 0 refills | Status: AC

## 2019-08-28 MED ORDER — AZITHROMYCIN 250 MG PO TABS: ORAL_TABLET | ORAL | 0 refills | Status: AC

## 2019-08-28 MED ORDER — GUAIFENESIN-CODEINE 100-10 MG/5ML PO SOLN: 5.0000 mL | Freq: Three times a day (TID) | ORAL | 0 refills | Status: AC | PRN

## 2019-08-28 NOTE — ED Provider Notes (Signed)
[facility] Emergency Department Provider Note   ____________________________________________    I have reviewed the triage vital signs and the nursing notes.   HISTORY  Chief Complaint Chest Pain  Spanish interpreter used   HPI Jasmine Bryant is a 49 y.o. female who presents with complaints of chest discomfort.  Patient reports she has been feeling ill since July 31, 2019.  She has lost sense of smell and taste, she is assumed that she has had novel coronavirus.  She is concerned because her brother recently died of coronavirus in [place].  She started prednisone recently after seeing urgent care she reports it is given her palpitations and discomfort in her chest.  She first took it yesterday.  Past Medical History:  Diagnosis Date  . Hypertension     Patient Active Problem List   Diagnosis Date Noted  . Chronic cholecystitis   . Acute cholecystitis 07/27/2017    Past Surgical History:  Procedure Laterality Date  . CESAREAN SECTION    . CHOLECYSTECTOMY N/A 07/27/2017   Procedure: LAPAROSCOPIC CHOLECYSTECTOMY;  Surgeon: Ancil Linsey, MD;  Location: [facility] ORS;  Service: General;  Laterality: N/A;    Prior to Admission medications   Medication Sig Start Date End Date Taking? Authorizing Provider  azithromycin (ZITHROMAX Z-PAK) 250 MG tablet Take 2 tablets (500 mg) on  Day 1,  followed by 1 tablet (250 mg) once daily on Days 2 through 5. 08/28/19 09/02/19  Jene Every, MD  cefpodoxime (VANTIN) 200 MG tablet Take 1 tablet (200 mg total) by mouth 2 (two) times daily for 7 days. 08/28/19 09/04/19  Jene Every, MD  DICYCLOMINE HCL PO Take by mouth.    [provider]  guaiFENesin-codeine 100-10 MG/5ML syrup Take 5 mLs by mouth 3 (three) times daily as needed for cough. 08/28/19   Jene Every, MD     Allergies Patient has no known allergies.  No family history on file.  Social History Social History   Tobacco Use  .  Smoking status: Never Smoker  . Smokeless tobacco: Never Used  Substance Use Topics  . Alcohol use: No  . Drug use: No    Review of Systems  Constitutional: Fatigue Eyes: No visual changes.  ENT: Mild sore throat Cardiovascular: Denies chest pain. Respiratory: Denies shortness of breath.  Cough Gastrointestinal: No abdominal pain.  Genitourinary: Negative for dysuria. Musculoskeletal: Myalgias Skin: Negative for rash. Neurological: Negative for headaches    ____________________________________________   PHYSICAL EXAM:  VITAL SIGNS: ED Triage Vitals  Enc Vitals Group     BP 08/28/19 0312 (!) 155/89     Pulse Rate 08/28/19 0312 91     Resp 08/28/19 0312 20     Temp 08/28/19 0312 97.9 F (36.6 C)     Temp Source 08/28/19 0312 Oral     SpO2 08/28/19 0312 97 %     Weight 08/28/19 0338 63.5 kg (140 lb)     Height --      Head Circumference --      Peak Flow --      Pain Score 08/28/19 0337 7     Pain Loc --      Pain Edu? --      Excl. in GC? --     Constitutional: Alert and oriented. No acute distress  Nose: No congestion/rhinnorhea. Mouth/Throat: Mucous membranes are moist.    Cardiovascular: Normal rate, regular rhythm.Peri Jefferson peripheral circulation. Respiratory: Normal respiratory effort.  No retractions.  Musculoskeletal: No lower extremity tenderness nor edema.  Warm and well perfused Neurologic:  Normal speech and language. No gross focal neurologic deficits are appreciated.  Skin:  Skin is warm, dry and intact. No rash noted. Psychiatric: Mood and affect are normal. Speech and behavior are normal.  ____________________________________________   LABS (all labs ordered are listed, but only abnormal results are displayed)  Labs Reviewed  CBC WITH DIFFERENTIAL/PLATELET - Abnormal; Notable for the following components:      Result Value   WBC 12.6 (*)    Hemoglobin 10.9 (*)    HCT 33.4 (*)    Platelets 420 (*)    All other components within normal  limits  COMPREHENSIVE METABOLIC PANEL - Abnormal; Notable for the following components:   Sodium 127 (*)    Potassium 3.2 (*)    Chloride 93 (*)    CO2 21 (*)    Glucose, Bld 103 (*)    BUN 5 (*)    All other components within normal limits  TROPONIN I (HIGH SENSITIVITY)  TROPONIN I (HIGH SENSITIVITY)   ____________________________________________  EKG  ED ECG REPORT I, Lavonia Drafts, the attending physician, personally viewed and interpreted this ECG.  Date: 08/28/2019  Rhythm: normal sinus rhythm QRS Axis: normal Intervals: normal ST/T Wave abnormalities: normal Narrative Interpretation: no evidence of acute ischemia  ____________________________________________  RADIOLOGY  Chest x-ray possible developing pneumonia ____________________________________________   PROCEDURES  Procedure(s) performed: No  Procedures   Critical Care performed: No ____________________________________________   INITIAL IMPRESSION / ASSESSMENT AND PLAN / ED COURSE  Pertinent labs & imaging results that were available during my care of the patient were reviewed by me and considered in my medical decision making (see chart for details).  Patient with clinical symptoms consistent with COVID-19 for prolonged time.  Chest x-ray demonstrates opacity concerning for possible bacterial pneumonia versus early viral pneumonia.  I will start the patient on antibiotics given mild elevation of white blood cell count which could be related to the prednisone she is on.  Overall she is well-appearing and oxygenating well, lab work demonstrates mild hyponatremia consistent with COVID-19 recommend outpatient follow-up return precautions discussed    ____________________________________________   FINAL CLINICAL IMPRESSION(S) / ED DIAGNOSES  Final diagnoses:  COVID-19  Community acquired pneumonia of right lower lobe of lung        Note:  This document was prepared using Therapist, sports and may include unintentional dictation errors.   Lavonia Drafts, MD 08/28/19 [phone]

## 2019-08-28 NOTE — ED Notes (Addendum)
Pt present to the ED with mid-CP that radiates to her L arm. Pt states her doctor put her on prednisone for possible COVID. Pt states she has been feeling bad since 12/04 and she hasn't gotten any better.

## 2019-08-28 NOTE — ED Triage Notes (Addendum)
Patient ambulatory to triage with steady gait, without difficulty or distress noted, mask in place; per [employer] video interpreter, pt reports since last night having mid CP radiating into left arm accomp by [facility]; seen by PCP yesterday and rx prednisone for "possible COVID" (but no swab was performed) and symptoms began after taking medication

## 2019-11-15 ENCOUNTER — Ambulatory Visit: Payer: Self-pay | Attending: Internal Medicine

## 2019-11-15 DIAGNOSIS — Z23 Encounter for immunization: Secondary | ICD-10-CM

## 2019-11-15 NOTE — Progress Notes (Signed)
   Covid-19 Vaccination Clinic  Name:  Jasmine Bryant    MRN: [id number] DOB: Aug 20, 1971  11/15/2019  Ms. Jasmine Bryant was observed post Covid-19 immunization for 15 minutes without incident. She was provided with Vaccine Information Sheet and instruction to access the V-Safe system.   Ms. Jasmine Bryant was instructed to call 911 with any severe reactions post vaccine: Marland Kitchen Difficulty breathing  . Swelling of face and throat  . A fast heartbeat  . A bad rash all over body  . Dizziness and weakness   Immunizations Administered    Name Date Dose VIS Date Route   Pfizer COVID-19 Vaccine 11/15/2019  5:16 PM 0.3 mL 08/07/2019 Intramuscular   Manufacturer: [employer], [employer]   Lot: [id number]   NDC: [address]-8

## 2019-12-06 ENCOUNTER — Ambulatory Visit: Payer: Self-pay | Attending: Internal Medicine

## 2019-12-06 DIAGNOSIS — Z23 Encounter for immunization: Secondary | ICD-10-CM

## 2019-12-06 NOTE — Progress Notes (Signed)
   Covid-19 Vaccination Clinic  Name:  Jasmine Bryant    MRN: [id number] DOB: 1970/10/20  12/06/2019  Ms. Jasmine Bryant was observed post Covid-19 immunization for 15 minutes without incident. She was provided with Vaccine Information Sheet and instruction to access the V-Safe system.   Ms. Jasmine Bryant was instructed to call 911 with any severe reactions post vaccine: Marland Kitchen Difficulty breathing  . Swelling of face and throat  . A fast heartbeat  . A bad rash all over body  . Dizziness and weakness   Immunizations Administered    Name Date Dose VIS Date Route   Pfizer COVID-19 Vaccine 12/06/2019  4:01 PM 0.3 mL 08/07/2019 Intramuscular   Manufacturer: [employer], [employer]   Lot: [id number]   NDC: [address]-0

## 2021-05-02 ENCOUNTER — Ambulatory Visit
Admission: RE | Admit: 2021-05-02 | Discharge: 2021-05-02 | Disposition: A | Discharge status: Home / Self Care | Payer: Self-pay | Source: Ambulatory Visit | Attending: Oncology | Admitting: Oncology

## 2021-05-02 ENCOUNTER — Ambulatory Visit: Payer: Self-pay | Attending: Oncology

## 2021-05-02 ENCOUNTER — Other Ambulatory Visit: Payer: Self-pay

## 2021-05-02 VITALS — BP 152/76 | HR 70 | Temp 98.1°F | Ht 60.24 in | Wt 131.2 lb

## 2021-05-02 DIAGNOSIS — N63 Unspecified lump in unspecified breast: Secondary | ICD-10-CM | POA: Insufficient documentation

## 2021-05-02 DIAGNOSIS — Z0000 Encounter for general adult medical examination without abnormal findings: Secondary | ICD-10-CM

## 2021-05-02 NOTE — Progress Notes (Addendum)
  Subjective:     Patient ID: Jasmine Bryant, female   DOB: 24-Oct-1970, 50 y.o.   MRN: [id number]  HPI   Review of Systems     Objective:   Physical Exam Chest:  Breasts:    Right: No swelling, bleeding, inverted nipple, mass, nipple discharge, skin change or tenderness.     Left: Mass and tenderness present.    Genitourinary:    Labia:        Right: No rash, tenderness, lesion or injury.        Left: No rash, tenderness, lesion or injury.      Vagina: No signs of injury and foreign body. No vaginal discharge, erythema, tenderness, bleeding, lesions or prolapsed vaginal walls.     Cervix: No cervical motion tenderness, discharge, friability, lesion, erythema, cervical bleeding or eversion.     Uterus: Not deviated, not enlarged, not fixed, not tender and no uterine prolapse.      Adnexa:        Right: No mass, tenderness or fullness.         Left: No mass, tenderness or fullness.          Comments: Cervical polyp      Assessment:     50 year old Hispanic patient referred for left breast mass.  Jesus from [facility] interpreted exam. Patient screened, and meets [facility] eligibility.  Patient does not have insurance, Medicare or Medicaid.  Instructed patient on breast self awareness using teach back method.  Patient reports left axillary midline pain.  Reports possibly muscular related to repetitive arm movement at work.  Palpated an oval mass, soft, mobile at 9 o'clock left breast adjacent to sternum.  Denies discomfort with this mass.  Pelvic exam reveals cervical polyp.   Risk Assessment     Risk Scores       05/02/2021   Last edited by: Jim Like, RN   5-year risk: 0.6 %   Lifetime risk: 5.2 %               Plan:     Sent for bilateral diagnostic mammogram. Specimen collected for pap.  Will refer to GYN when pap results return.

## 2021-05-02 NOTE — Progress Notes (Signed)
  Subjective:     Patient ID: Jasmine Bryant, female   DOB: Apr 11, 1971, 50 y.o.   MRN: [id number]  HPI   Review of Systems     Objective:   Physical Exam     Assessment:     None    Plan:     None

## 2021-05-05 LAB — IGP, APTIMA HPV: HPV Aptima: NEGATIVE

## 2021-05-09 NOTE — Progress Notes (Signed)
Patient ID: Jasmine Bryant, female   DOB: 08-30-1970, 50 y.o.   MRN: [id number] Phoned patient with [facility] 2 mammogram results and neg/neg pap results.  She is scheduled with Dr. Tiburcio Pea on 06/14/21 for consult on cervical polyp.  Mailed reminder.  Copy to [facility].

## 2021-05-09 NOTE — Progress Notes (Signed)
Patient scheduled with Dr. Tiburcio Pea at [facility] 06/14/21 at 9:00 for consult on cervical polyp.  Instructed to arrive at 8:45.  Mailed reminder to patient.

## 2021-06-14 ENCOUNTER — Encounter: Payer: Self-pay | Admitting: Obstetrics & Gynecology

## 2023-02-24 IMAGING — MG DIGITAL DIAGNOSTIC BILAT W/ TOMO W/ CAD
6 of 12 series · 6 of 36 positions shown · non-contrast
Comparison: Screening mammogram 09/02/2013

CLINICAL DATA: Palpable lump in the central inner left breast and
focal pain in the left axilla x2 weeks.

EXAM:
DIGITAL DIAGNOSTIC BILATERAL MAMMOGRAM WITH TOMOSYNTHESIS AND CAD;
ULTRASOUND LEFT BREAST LIMITED
TECHNIQUE: Bilateral digital diagnostic mammography and breast tomosynthesis
was performed. The images were evaluated with computer-aided
detection.; Targeted ultrasound examination of the left breast was
performed.

[L TAN synth-2D (1 of 2)]
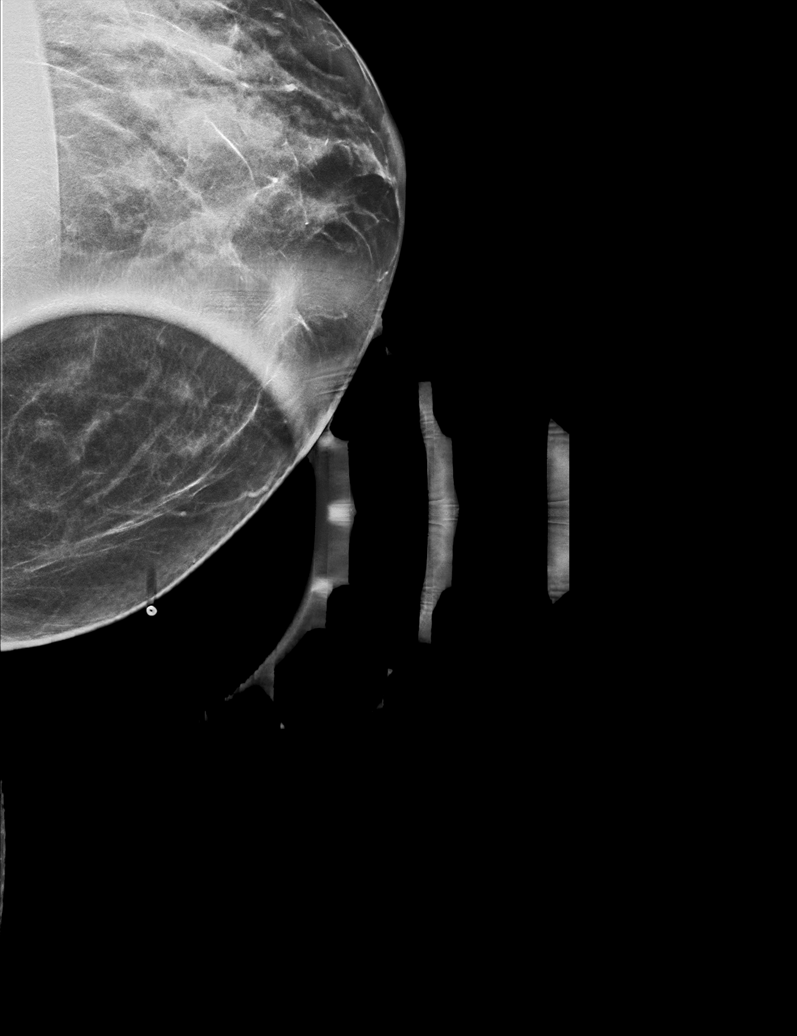

[L CC synth-2D]
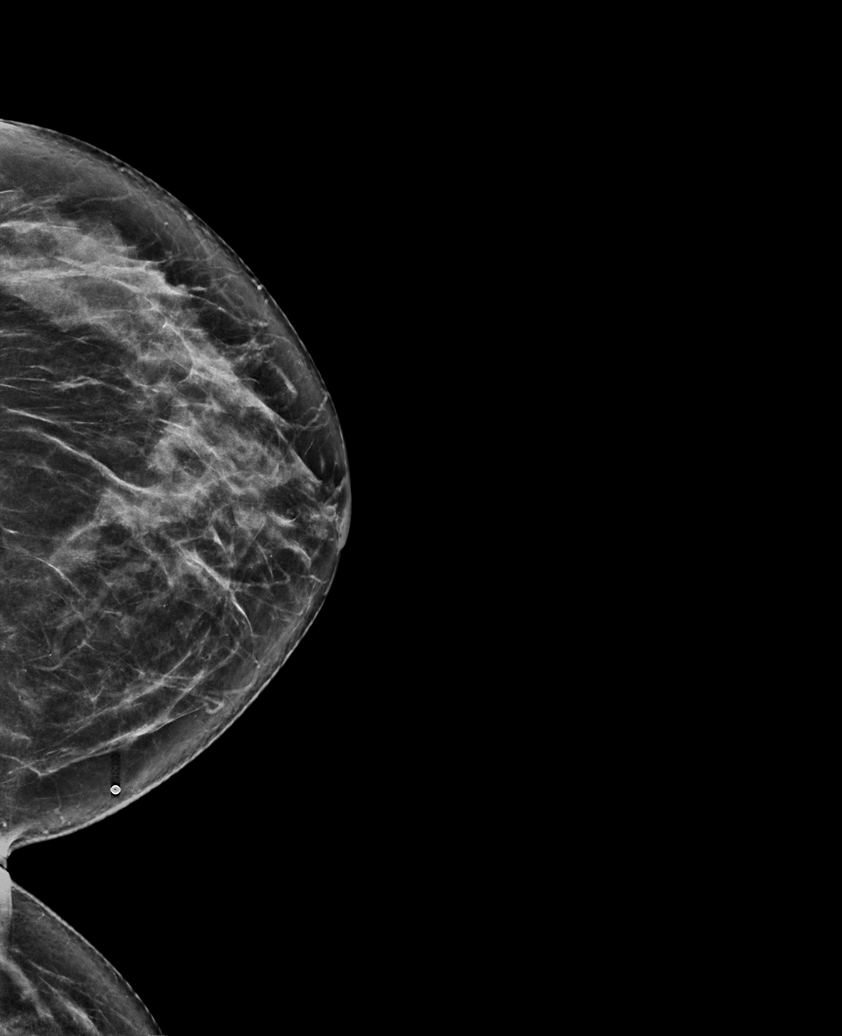

[R MLO synth-2D]
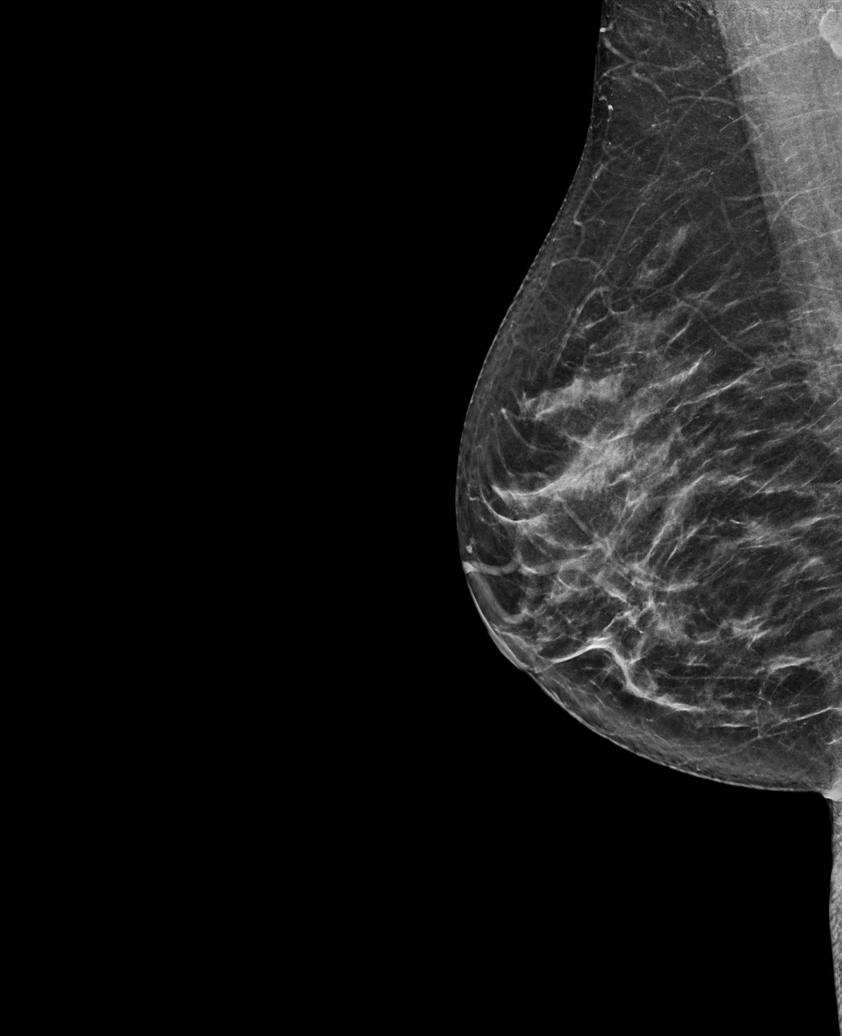

[L TAN synth-2D (2 of 2)]
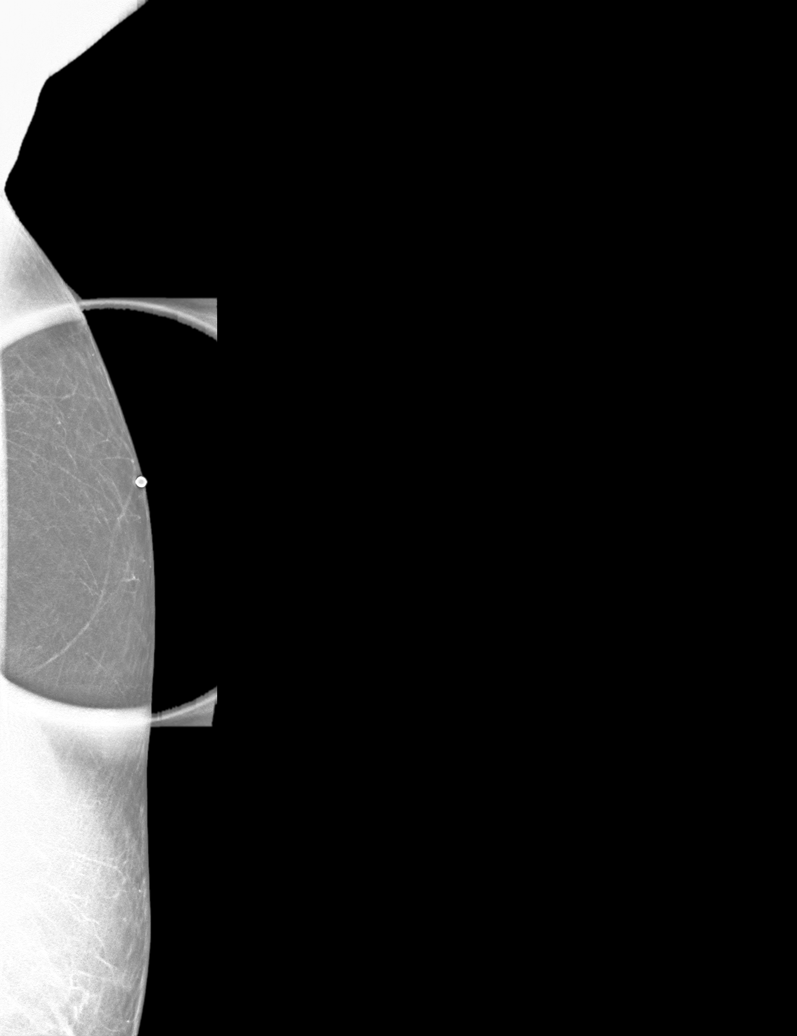

[R CC synth-2D]
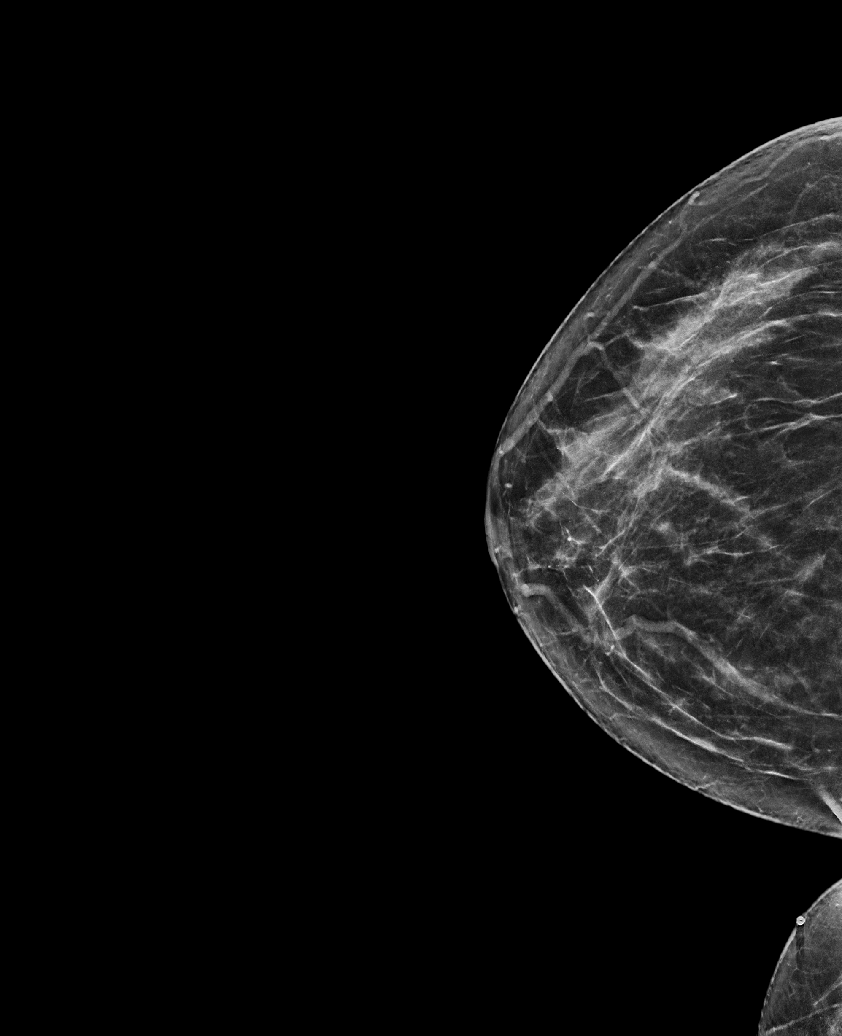

[L MLO synth-2D]
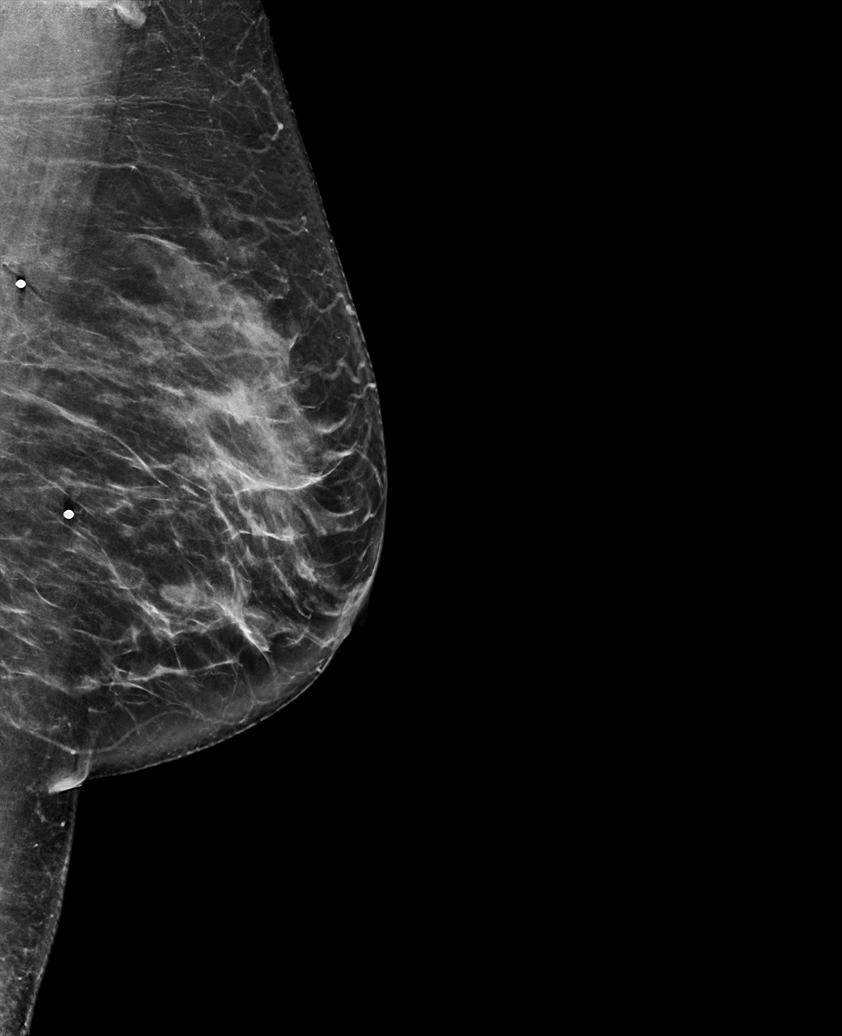

[6 of 36 positions shown; findings below may reference images not displayed]

ACR Breast Density Category c: The breast tissue is heterogeneously
dense, which may obscure small masses.
FINDINGS: Multiple bilateral round and oval circumscribed masses of similar
sizes, consistent with a benign process such as cysts.

LEFT breast: A metallic BB placed on the skin denotes the patient
indicated area of palpable abnormality in the central inner left
breast. Directly underlying the skin marker on tangential view is a
4 cm circumscribed fat density mass.

On physical exam, I appreciate a soft mobile oval mass.

Targeted ultrasound of the left breast at the 10 o'clock position 9
cm from the nipple, at the patient indicated area of palpable
abnormality, demonstrates a 4.4 x 2.9 x 1.0 cm superficial
circumscribed oval hypoechoic mass, corresponding to the
mammographic finding and consistent with a lipoma.

A metallic BB placed on the skin denotes the patient indicated area
of focal pain in the low axilla. No underlying mammographic
abnormality. On physical exam, I do not appreciate a discrete mass.
The patient does note tenderness on palpation.

Targeted ultrasound of the left breast 3 to 4 o'clock positions 15
cm from the nipple, at site of patient indicated focal pain, was
performed. No suspicious solid or cystic mass.

RIGHT breast: No suspicious mass, calcification, or other findings
in the right breast.
IMPRESSION: LEFT breast:

1. A 4.4 cm lipoma accounts for the palpable lump in the inner left
breast. No additional dedicated follow-up imaging is indicated.
2. No mammographic or sonographic abnormality at the patient
indicated area of focal pain in the left breast.

RIGHT breast: No mammographic findings of malignancy in the right
breast.

RECOMMENDATION:
Recommend clinical follow-up for left lateral breast pain. Any
further workup of the patient's symptoms should be based on the
clinical assessment.

Recommend routine annual screening mammogram in 1 year.

I have discussed the findings and recommendations with the patient.
If applicable, a reminder letter will be sent to the patient
regarding the next appointment.

BI-RADS CATEGORY  2: Benign.

## 2023-08-13 ENCOUNTER — Encounter: Payer: Self-pay | Admitting: Family Medicine

## 2023-09-04 ENCOUNTER — Other Ambulatory Visit: Payer: Self-pay

## 2023-09-04 DIAGNOSIS — Z1231 Encounter for screening mammogram for malignant neoplasm of breast: Secondary | ICD-10-CM

## 2023-10-01 ENCOUNTER — Ambulatory Visit: Payer: Self-pay

## 2023-11-11 ENCOUNTER — Ambulatory Visit: Payer: Self-pay

## 2023-11-20 NOTE — Addendum Note (Signed)
 Addended by: Narda Rutherford on: 11/20/2023 01:59 PM   Modules accepted: Orders

## 2024-01-13 ENCOUNTER — Ambulatory Visit: Payer: Self-pay

## 2024-01-15 ENCOUNTER — Ambulatory Visit
Admission: RE | Admit: 2024-01-15 | Discharge: 2024-01-15 | Disposition: A | Discharge status: Home / Self Care | Payer: Self-pay | Source: Ambulatory Visit | Attending: Family Medicine | Admitting: Family Medicine

## 2024-01-15 DIAGNOSIS — Z1231 Encounter for screening mammogram for malignant neoplasm of breast: Secondary | ICD-10-CM | POA: Insufficient documentation
